# Patient Record
Sex: Female | Born: 1990 | Race: Black or African American | Hispanic: No | Marital: Single | Smoking: Never smoker
Health system: Southern US, Community
[De-identification: ages and names within clinical notes are randomized; demographics above are authoritative.]

## PROBLEM LIST (undated history)

## (undated) DIAGNOSIS — Z789 Other specified health status: Secondary | ICD-10-CM

## (undated) HISTORY — PX: HAND SURGERY: SHX662

## (undated) HISTORY — DX: Other specified health status: Z78.9

---

## 2009-02-28 ENCOUNTER — Emergency Department (HOSPITAL_COMMUNITY): Admission: EM | Admit: 2009-02-28 | Discharge: 2009-02-28 | Payer: Self-pay | Admitting: Emergency Medicine

## 2010-03-28 ENCOUNTER — Emergency Department (HOSPITAL_COMMUNITY): Admission: EM | Admit: 2010-03-28 | Discharge: 2010-03-28 | Payer: Self-pay | Admitting: Emergency Medicine
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
## 2011-04-20 ENCOUNTER — Emergency Department (HOSPITAL_COMMUNITY)
Admission: EM | Admit: 2011-04-20 | Discharge: 2011-04-20 | Disposition: A | Discharge status: Home / Self Care | Payer: No Typology Code available for payment source | Attending: Emergency Medicine | Admitting: Emergency Medicine

## 2011-04-20 ENCOUNTER — Encounter: Payer: Self-pay | Admitting: *Deleted

## 2011-04-20 DIAGNOSIS — R6883 Chills (without fever): Secondary | ICD-10-CM | POA: Insufficient documentation

## 2011-04-20 DIAGNOSIS — R059 Cough, unspecified: Secondary | ICD-10-CM | POA: Insufficient documentation

## 2011-04-20 DIAGNOSIS — J329 Chronic sinusitis, unspecified: Secondary | ICD-10-CM | POA: Insufficient documentation

## 2011-04-20 DIAGNOSIS — J3489 Other specified disorders of nose and nasal sinuses: Secondary | ICD-10-CM | POA: Insufficient documentation

## 2011-04-20 DIAGNOSIS — IMO0001 Reserved for inherently not codable concepts without codable children: Secondary | ICD-10-CM | POA: Insufficient documentation

## 2011-04-20 DIAGNOSIS — R05 Cough: Secondary | ICD-10-CM | POA: Insufficient documentation

## 2011-04-20 DIAGNOSIS — R51 Headache: Secondary | ICD-10-CM | POA: Insufficient documentation

## 2011-04-20 MED ORDER — FLUTICASONE PROPIONATE 50 MCG/ACT NA SUSP: 1.0000 | Freq: Every day | NASAL | Status: DC

## 2011-04-20 MED ORDER — IBUPROFEN 800 MG PO TABS: 800.0000 mg | ORAL_TABLET | Freq: Three times a day (TID) | ORAL | Status: AC

## 2011-04-20 NOTE — ED Notes (Signed)
Pt discharged home. Had no further questions. Vital signs stable. 

## 2011-04-20 NOTE — ED Provider Notes (Signed)
History     CSN: [id number] Arrival date & time: 04/20/2011  3:17 PM  5:29 PM HPI Patient reports sinus pressure. States associated with a frontal headache, cough, body aches, chills. 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 Denies fever, neck pain, chest pain, shortness of breath, sore throat, or ear pain. Reports she has tried tension headache medication without relief. Patient is a 20 y.o. female presenting with sinusitis. The history is provided by the patient.  Sinusitis  The current episode started 2 days ago. The problem has been gradually worsening. There has been no fever. The pain is mild. The pain has been constant since onset. Associated symptoms include chills, congestion, sinus pressure and cough. Pertinent negatives include no sweats, no ear pain, no hoarse voice, no sore throat, no swollen glands and no shortness of breath. She has tried acetaminophen for the symptoms. The treatment provided no relief.    History reviewed. No pertinent past medical history.  History reviewed. No pertinent past surgical history.  History reviewed. No pertinent family history.  History  Substance Use Topics  . Smoking status: Never Smoker   . Smokeless tobacco: Not on file  . Alcohol Use: No    OB History    Grav Para Term Preterm Abortions TAB SAB Ect Mult Living                  Review of Systems  Constitutional: Positive for chills. Negative for fever.  HENT: Positive for congestion, rhinorrhea and sinus pressure. Negative for ear pain, sore throat, hoarse voice, neck pain and neck stiffness.   Respiratory: Positive for cough. Negative for shortness of breath.   Gastrointestinal: Negative for nausea, vomiting and abdominal pain.  Genitourinary: Negative for dysuria, flank pain and vaginal discharge.  Musculoskeletal: Negative for back pain.  Neurological: Positive for headaches. Negative for dizziness, syncope, weakness and  numbness.  All other systems reviewed and are negative.    Allergies  Review of patient's allergies indicates no known allergies.  Home Medications   Current Outpatient Rx  Name Route Sig Dispense Refill  . DIPHENHYDRAMINE-APAP (SLEEP) 25-500 MG PO TABS Oral Take 2 tablets by mouth at bedtime as needed. Sleep/ cold       BP 107/80  Pulse 81  Temp(Src) 98.8 F (37.1 C) (Oral)  Resp 16  SpO2 100%  Physical Exam  Vitals reviewed. Constitutional: She is oriented to person, place, and time. Vital signs are normal. She appears well-developed and well-nourished.  HENT:  Head: Normocephalic and atraumatic.  Right Ear: Tympanic membrane, external ear and ear canal normal.  Left Ear: Tympanic membrane, external ear and ear canal normal.  Nose: No rhinorrhea. Right sinus exhibits maxillary sinus tenderness. Left sinus exhibits maxillary sinus tenderness.  Mouth/Throat: Uvula is midline, oropharynx is clear and moist and mucous membranes are normal.  Eyes: Conjunctivae are normal. Pupils are equal, round, and reactive to light.  Neck: Trachea normal, normal range of motion and full passive range of motion without pain. Neck supple. No spinous process tenderness and no muscular tenderness present. No rigidity. No mass present.  Cardiovascular: Normal rate, regular rhythm and normal heart sounds.  Exam reveals no friction rub.   No murmur heard. Pulmonary/Chest: Effort normal and breath sounds normal. She has no wheezes. She has no rhonchi. She has no rales. She exhibits no tenderness.  Musculoskeletal: Normal range of motion.  Neurological: She is alert and oriented to person, place,  and time. Coordination normal.  Skin: Skin is warm and dry. No rash noted. No erythema. No pallor.    ED Course  Procedures  MDM   Will treat patient for a sinusitis. Advised sinus drainage instructions. Will prescribe ibuprofen 800 mg and fluticasone. Advised to return for worsening symptoms. Patient  agrees to plan and is ready for discharge.       Thomasene Lot, [place] 04/20/11 [phone]

## 2011-04-20 NOTE — ED Notes (Signed)
Pt presents to department for evaluation of nasal congestion, headache, and productive cough. 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 Pt states "I feel like crap." she is alert and oriented x4. Respiration unlabored. Skin warm and dry. Ambulatory to exam room. No signs of distress at the time.

## 2011-04-20 NOTE — ED Notes (Signed)
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 Reports productive cough and runny nose. Denies any fevers. Reports headache.

## 2011-04-21 NOTE — ED Provider Notes (Signed)
Medical screening examination/treatment/procedure(s) were performed by non-physician practitioner and as supervising physician I was immediately available for consultation/collaboration.   Armanii Urbanik A. Patrica Duel, MD 04/21/11 1346
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
## 2011-07-22 ENCOUNTER — Encounter (HOSPITAL_COMMUNITY): Payer: Self-pay | Admitting: Nurse Practitioner

## 2011-07-22 ENCOUNTER — Emergency Department (HOSPITAL_COMMUNITY)
Admission: EM | Admit: 2011-07-22 | Discharge: 2011-07-22 | Disposition: A | Discharge status: Home / Self Care | Payer: No Typology Code available for payment source | Attending: Emergency Medicine | Admitting: Emergency Medicine

## 2011-07-22 DIAGNOSIS — R112 Nausea with vomiting, unspecified: Secondary | ICD-10-CM | POA: Insufficient documentation

## 2011-07-22 LAB — URINALYSIS, ROUTINE W REFLEX MICROSCOPIC
Hgb urine dipstick: NEGATIVE
Ketones, ur: 15 mg/dL — AB
Protein, ur: NEGATIVE mg/dL
Urobilinogen, UA: 2 mg/dL — ABNORMAL HIGH (ref 0.0–1.0)

## 2011-07-22 MED ORDER — ONDANSETRON 4 MG PO TBDP: 4.0000 mg | ORAL_TABLET | Freq: Three times a day (TID) | ORAL | Status: AC | PRN

## 2011-07-22 MED ORDER — ONDANSETRON 4 MG PO TBDP
8.0000 mg | ORAL_TABLET | Freq: Once | ORAL | Status: AC
  Administered 2011-07-22: 8 mg via ORAL
  Filled 2011-07-22: qty 2

## 2011-07-22 NOTE — Discharge Instructions (Signed)
Nausea and Vomiting  Nausea is a sick feeling that often comes before throwing up (vomiting). Vomiting is a reflex where stomach contents come out of your mouth. Vomiting can cause severe loss of body fluids (dehydration). Children and elderly adults can become dehydrated quickly, especially if they also have diarrhea. Nausea and vomiting are symptoms of a condition or disease. It is important to find the cause of your symptoms.  CAUSES    Direct irritation of the stomach lining. This irritation can result from increased acid production (gastroesophageal reflux disease), infection, food poisoning, taking certain medicines (such as nonsteroidal anti-inflammatory drugs), alcohol use, or tobacco use.   Signals from the brain.These signals could be caused by a headache, heat exposure, an inner ear disturbance, increased pressure in the brain from injury, infection, a tumor, or a concussion, pain, emotional stimulus, or metabolic problems.   An obstruction in the gastrointestinal tract (bowel obstruction).   Illnesses such as diabetes, hepatitis, gallbladder problems, appendicitis, kidney problems, cancer, sepsis, atypical symptoms of a heart attack, or eating disorders.   Medical treatments such as chemotherapy and radiation.   Receiving medicine that makes you sleep (general anesthetic) during surgery.  DIAGNOSIS  Your caregiver may ask for tests to be done if the problems do not improve after a few days. Tests may also be done if symptoms are severe or if the reason for the nausea and vomiting is not clear. Tests may include:   Urine tests.   Blood tests.   Stool tests.   Cultures (to look for evidence of infection).   X-rays or other imaging studies.  Test results can help your caregiver make decisions about treatment or the need for additional tests.  TREATMENT  You need to stay well hydrated. Drink frequently but in small amounts.You may wish to drink water, sports drinks, clear broth, or eat frozen  ice pops or gelatin dessert to help stay hydrated.When you eat, eating slowly may help prevent nausea.There are also some antinausea medicines that may help prevent nausea.  HOME CARE INSTRUCTIONS    Take all medicine as directed by your caregiver.   If you do not have an appetite, do not force yourself to eat. However, you must continue to drink fluids.   If you have an appetite, eat a normal diet unless your caregiver tells you differently.   Eat a variety of complex carbohydrates (rice, wheat, potatoes, bread), lean meats, yogurt, fruits, and vegetables.   Avoid high-fat foods because they are more difficult to digest.   Drink enough water and fluids to keep your urine clear or pale yellow.   If you are dehydrated, ask your caregiver for specific rehydration instructions. Signs of dehydration may include:   Severe thirst.   Dry lips and mouth.   Dizziness.   Dark urine.   Decreasing urine frequency and amount.   Confusion.   Rapid breathing or pulse.  SEEK IMMEDIATE MEDICAL CARE IF:    You have blood or brown flecks (like coffee grounds) in your vomit.   You have black or bloody stools.   You have a severe headache or stiff neck.   You are confused.   You have severe abdominal pain.   You have chest pain or trouble breathing.   You do not urinate at least once every 8 hours.   You develop cold or clammy skin.   You continue to vomit for longer than 24 to 48 hours.   You have a fever.  MAKE SURE YOU:      Understand these instructions.   Will watch your condition.   Will get help right away if you are not doing well or get worse.  Document Released: 05/03/2005 Document Revised: 04/22/2011 Document Reviewed: 09/30/2010  [employer] Patient Information 2012 [employer]  B.R.A.T. Diet  Your doctor has recommended the B.R.A.T. diet for you or your child until the condition improves. This is often used to help control diarrhea and vomiting symptoms. If you or your child can tolerate clear  liquids, you may have:   Bananas.   Rice.   Applesauce.   Toast (and other simple starches such as crackers, potatoes, noodles).  Be sure to avoid dairy products, meats, and fatty foods until symptoms are better. Fruit juices such as apple, grape, and prune juice can make diarrhea worse. Avoid these. Continue this diet for 2 days or as instructed by your caregiver.  Document Released: 05/03/2005 Document Revised: 04/22/2011 Document Reviewed: 10/20/2006  [employer] Patient Information 2012 [employer]

## 2011-07-22 NOTE — ED Notes (Signed)
Patient given po fluids. Will monitor for nausea and vomiting.

## 2011-07-22 NOTE — ED Provider Notes (Signed)
History     CSN: [id number]  Arrival date & time 07/22/11  1130   First MD Initiated Contact with Patient 07/22/11 1251      Chief Complaint  Patient presents with  . Nausea  . Emesis    (Consider location/radiation/quality/duration/timing/severity/associated sxs/prior treatment) HPI  Patient presents to emergency department complaining of gradual onset nausea and vomiting that began at noon yesterday. 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 Patient states that since noon yesterday she's had a proximally 5-6 episodes of vomiting stating that she has eaten nothing and drank small amounts since onset of symptoms. She denies any point specific abdominal pain. She states "my stomach feels queasy" but denies any abdominal pain. Patient denies aggravating or alleviating factors. She's taken no medication person prior to arrival. Patient states she has no known medical problems and takes no medicines on regular basis. Chest fevers, chills, chest pain, shortness of breath, abdominal pain, dysuria, hematuria, blood in her stool, vaginal discharge.  History reviewed. No pertinent past medical history.  History reviewed. No pertinent past surgical history.  History reviewed. No pertinent family history.  History  Substance Use Topics  . Smoking status: Never Smoker   . Smokeless tobacco: Not on file  . Alcohol Use: Yes     social    OB History    Grav Para Term Preterm Abortions TAB SAB Ect Mult Living                  Review of Systems  All other systems reviewed and are negative.    Allergies  Review of patient's allergies indicates no known allergies.  Home Medications   Current Outpatient Rx  Name Route Sig Dispense Refill  . IBUPROFEN 200 MG PO TABS Oral Take 400 mg by mouth every 6 (six) hours as needed. For cramps      BP 123/70  Pulse 70  Temp(Src) 98.8 F  (37.1 C) (Oral)  Resp 20  Ht 5\' 3"  (1.6 m)  Wt 145 lb (65.772 kg)  BMI 25.69 kg/m2  SpO2 100%  LMP 07/16/2011  Physical Exam  Nursing note and vitals reviewed. Constitutional: She is oriented to person, place, and time. She appears well-developed and well-nourished. No distress.  HENT:  Head: Normocephalic and atraumatic.  Eyes: Conjunctivae are normal.  Neck: Normal range of motion. Neck supple.  Cardiovascular: Normal rate, regular rhythm, normal heart sounds and intact distal pulses.  Exam reveals no gallop and no friction rub.   No murmur heard. Pulmonary/Chest: Effort normal and breath sounds normal. No respiratory distress. She has no wheezes. She has no rales. She exhibits no tenderness.  Abdominal: Bowel sounds are normal. She exhibits no distension and no mass. There is no tenderness. There is no rebound and no guarding.  Musculoskeletal: Normal range of motion. She exhibits no edema and no tenderness.  Neurological: She is alert and oriented to person, place, and time.  Skin: Skin is warm and dry. No rash noted. She is not diaphoretic. No erythema.  Psychiatric: She has a normal mood and affect.    ED Course  Procedures (including critical care time)  ODT zofran   Labs Reviewed  POCT PREGNANCY, URINE  URINALYSIS, ROUTINE W REFLEX MICROSCOPIC   No results found.   1. Nausea and vomiting       MDM  Patient is nontoxic-appearing and afebrile. Abdomen soft and nontender. She is no  known medical problems takes no medicines on regular basis. Question gastrointestinal illness given multiple sick contacts with GI symptoms. Spoke at length with patient about changing or worsening symptoms that should warrent return to ER otherwise will give symptomatic treatment for home        Jenness Corner, [place] 07/22/11 1322

## 2011-07-22 NOTE — ED Notes (Signed)
Discharged home with prescriptions and written instructions 

## 2011-07-22 NOTE — ED Notes (Signed)
Patient able to take fluids at present. Denies nausea.

## 2011-07-22 NOTE — ED Notes (Signed)
C/o upset stomach, n/v onset last night. Denies bowel/bladder changes

## 2011-07-23 NOTE — ED Provider Notes (Signed)
Medical screening examination/treatment/procedure(s) were performed by non-physician practitioner and as supervising physician I was immediately available for consultation/collaboration.  Magenta Schmiesing T Kentley Cedillo, MD 07/23/11 0912 

## 2013-01-01 ENCOUNTER — Emergency Department (INDEPENDENT_AMBULATORY_CARE_PROVIDER_SITE_OTHER)
Admission: EM | Admit: 2013-01-01 | Discharge: 2013-01-01 | Disposition: A | Discharge status: Home / Self Care | Payer: No Typology Code available for payment source | Source: Home / Self Care

## 2013-01-01 ENCOUNTER — Encounter (HOSPITAL_COMMUNITY): Payer: Self-pay

## 2013-01-01 ENCOUNTER — Other Ambulatory Visit (HOSPITAL_COMMUNITY)
Admission: RE | Admit: 2013-01-01 | Discharge: 2013-01-01 | Disposition: A | Discharge status: Home / Self Care | Payer: No Typology Code available for payment source | Source: Ambulatory Visit | Attending: Emergency Medicine | Admitting: Emergency Medicine

## 2013-01-01 DIAGNOSIS — Z113 Encounter for screening for infections with a predominantly sexual mode of transmission: Secondary | ICD-10-CM | POA: Insufficient documentation

## 2013-01-01 DIAGNOSIS — N760 Acute vaginitis: Secondary | ICD-10-CM | POA: Insufficient documentation

## 2013-01-01 DIAGNOSIS — T192XXA Foreign body in vulva and vagina, initial encounter: Secondary | ICD-10-CM

## 2013-01-01 LAB — POCT URINALYSIS DIP (DEVICE)
Hgb urine dipstick: NEGATIVE
Ketones, ur: NEGATIVE mg/dL
Protein, ur: NEGATIVE mg/dL
Specific Gravity, Urine: 1.025 (ref 1.005–1.030)
Urobilinogen, UA: 0.2 mg/dL (ref 0.0–1.0)

## 2013-01-01 MED ORDER — METRONIDAZOLE 500 MG PO TABS: 500.0000 mg | ORAL_TABLET | Freq: Two times a day (BID) | ORAL | Status: DC

## 2013-01-01 NOTE — ED Notes (Signed)
Call back number for labs verified 

## 2013-01-01 NOTE — ED Notes (Signed)
C/o ~2 month duration of vaginal discharge, has started to look grey and has an odor

## 2013-01-01 NOTE — ED Provider Notes (Signed)
Chief Complaint:   Chief Complaint  Patient presents with  . Vaginal Discharge    History of Present Illness:   Tricia Jones is a 22 year old female who has had a 2 month history of a malodorous, brown vaginal discharge. She denies any itching or vaginal or vulvar irritation. She's had no pelvic or lower back pain, external lesions, fever, chills, nausea, or vomiting. No urinary symptoms. Her menses are regular. Last menstrual period was July 27. Patient is sexually active with use of condoms about 50% of the time. She does not think she is pregnant. No prior history of STDs, vaginitis, or other GYN problems.  Review of Systems:  Other than noted above, the patient denies any of the following symptoms: Systemic:  No fever, chills, sweats, or weight loss. GI:  No abdominal pain, nausea, anorexia, vomiting, diarrhea, constipation, melena or hematochezia. GU:  No dysuria, frequency, urgency, hematuria, vaginal discharge, itching, or abnormal vaginal bleeding. Skin:  No rash or itching.  PMFSH:  Past medical history, family history, social history, meds, and allergies were reviewed.    Physical Exam:   Vital signs:  BP 110/71  Pulse 77  Temp(Src) 98.6 F (37 C) (Oral)  Resp 10  SpO2 98% General:  Alert, oriented and in no distress. Lungs:  Breath sounds clear and equal bilaterally.  No wheezes, rales or rhonchi. Heart:  Regular rhythm.  No gallops or murmers. Abdomen:  Soft, flat and non-distended.  No organomegaly or mass.  No tenderness, guarding or rebound.  Bowel sounds normally active. Pelvic exam:  Normal external genitalia, speculum exam reveals a retained tampon. This was removed with a ring forceps. There was some yellowish discharge present. Cervix and vaginal mucosa otherwise appeared normal. No pain on cervical motion. Uterus was normal in size and shape and nontender. No adnexal masses or tenderness. Skin:  Clear, warm and dry.  Labs:   Results for orders placed during the  hospital encounter of 01/01/13  POCT URINALYSIS DIP (DEVICE)      Result Value Range   Glucose, UA NEGATIVE  NEGATIVE mg/dL   Bilirubin Urine NEGATIVE  NEGATIVE   Ketones, ur NEGATIVE  NEGATIVE mg/dL   Specific Gravity, Urine 1.025  1.005 - 1.030   Hgb urine dipstick NEGATIVE  NEGATIVE   pH 7.0  5.0 - 8.0   Protein, ur NEGATIVE  NEGATIVE mg/dL   Urobilinogen, UA 0.2  0.0 - 1.0 mg/dL   Nitrite NEGATIVE  NEGATIVE   Leukocytes, UA NEGATIVE  NEGATIVE  POCT PREGNANCY, URINE      Result Value Range   Preg Test, Ur NEGATIVE  NEGATIVE    Vaginal DNA probes for gonorrhea, Chlamydia, Trichomonas, Gardnerella, Candida obtained.  Assessment:  The primary encounter diagnosis was Vaginitis. A diagnosis of Retained tampon, initial encounter was also pertinent to this visit.  She has copious malodorous discharge after the tampon was removed, will treat as for BV.  Plan:   1.  The following meds were prescribed:   Discharge Medication List as of 01/01/2013  9:02 PM    START taking these medications   Details  metroNIDAZOLE (FLAGYL) 500 MG tablet Take 1 tablet (500 mg total) by mouth 2 (two) times daily., Starting 01/01/2013, Until Discontinued, Normal       2.  The patient was instructed in symptomatic care and handouts were given. 3.  The patient was told to return if becoming worse in any way, if no better in 3 or 4 days, and given some red  flag symptoms such as pelvic pain or fever that would indicate earlier return. 4.  Follow up here as necessary.    Reuben Likes, MD 01/01/13 2157

## 2013-01-03 NOTE — ED Notes (Signed)
GC/Chlamydia neg., Affirm: Candida and Trich neg., Gardnerella pos.  Pt. adequately treated with Flagyl. Vassie Moselle 01/03/2013

## 2013-02-15 ENCOUNTER — Emergency Department (HOSPITAL_COMMUNITY)
Admission: EM | Admit: 2013-02-15 | Discharge: 2013-02-15 | Disposition: A | Discharge status: Home / Self Care | Payer: No Typology Code available for payment source | Attending: Emergency Medicine | Admitting: Emergency Medicine

## 2013-02-15 ENCOUNTER — Encounter (HOSPITAL_COMMUNITY): Payer: Self-pay | Admitting: Emergency Medicine

## 2013-02-15 DIAGNOSIS — N899 Noninflammatory disorder of vagina, unspecified: Secondary | ICD-10-CM | POA: Insufficient documentation

## 2013-02-15 DIAGNOSIS — N898 Other specified noninflammatory disorders of vagina: Secondary | ICD-10-CM

## 2013-02-15 MED ORDER — HYDROCODONE-ACETAMINOPHEN 5-325 MG PO TABS
1.0000 | ORAL_TABLET | Freq: Once | ORAL | Status: DC
  Filled 2013-02-15: qty 1

## 2013-02-15 MED ORDER — HYDROCODONE-ACETAMINOPHEN 5-325 MG PO TABS: ORAL_TABLET | ORAL | Status: DC

## 2013-02-15 NOTE — ED Notes (Signed)
Pt. reports abscess at left groin with no drainage onset 1 week ago / pain radiating to left upper inner thigh.

## 2013-02-15 NOTE — ED Notes (Signed)
Pt. States she was on antibiotic for vaginal discharge after tampon was left in vaginal for "a couple months". Pt. States she did not take antibiotic correctly. Denies discharge at this time but reports abscess on left groin area.

## 2013-02-15 NOTE — ED Provider Notes (Signed)
CSN: [id number]     Arrival date & time 02/15/13  1857 History   First MD Initiated Contact with Patient 02/15/13 2026     Chief Complaint  Patient presents with  . Abscess   (Consider location/radiation/quality/duration/timing/severity/associated sxs/prior Treatment) HPI  Tricia Jones is a 22 y.o. female complaining of pain and swelling to left labia worsening over the course of the day. Pt denies prior episodes, fever, N/V, abd pain. States that she was treated with antibiotic after a tampon was found in her vagina for unkown time period. Did not finnish off her antibiotics as directed (12/2012)  History reviewed. No pertinent past medical history. Past Surgical History  Procedure Laterality Date  . Hand surgery     No family history on file. History  Substance Use Topics  . Smoking status: Never Smoker   . Smokeless tobacco: Not on file  . Alcohol Use: Yes     Comment: social   OB History   Grav Para Term Preterm Abortions TAB SAB Ect Mult Living                 Review of Systems 10 systems reviewed and found to be negative, except as noted in the HPI   Allergies  Review of patient's allergies indicates no known allergies.  Home Medications   Current Outpatient Rx  Name  Route  Sig  Dispense  Refill  . ibuprofen (ADVIL,MOTRIN) 200 MG tablet   Oral   Take 400 mg by mouth every 6 (six) hours as needed (cramps).           BP 115/68  Pulse 61  Temp(Src) 98.2 F (36.8 C) (Oral)  Resp 14  SpO2 100%  LMP 01/31/2013 Physical Exam  Nursing note and vitals reviewed. Constitutional: She is oriented to person, place, and time. She appears well-developed and well-nourished. No distress.  HENT:  Head: Normocephalic and atraumatic.  Eyes: Conjunctivae and EOM are normal. Pupils are equal, round, and reactive to light.  Neck: Normal range of motion.  Cardiovascular: Normal rate.   Pulmonary/Chest: Effort normal and breath sounds normal. No stridor. No respiratory  distress. She has no wheezes. She has no rales. She exhibits no tenderness.  Abdominal: Soft. Bowel sounds are normal. She exhibits no distension and no mass. There is no tenderness. There is no rebound and no guarding.  Genitourinary: Vagina normal.    No vaginal discharge found.  Musculoskeletal: Normal range of motion.  Neurological: She is alert and oriented to person, place, and time.  Psychiatric: She has a normal mood and affect.    ED Course  Procedures (including critical care time) Labs Review Labs Reviewed - No data to display Imaging Review No results found.  MDM   1. Vaginal irritation    Filed Vitals:   02/15/13 1928 02/15/13 2127  BP: 115/68 124/79  Pulse: 61 57  Temp: 98.2 F (36.8 C) 98.1 F (36.7 C)  TempSrc: Oral Oral  Resp: 14 16  SpO2: 100% 100%     [name] is a 22 y.o. female pt reports painful bump to left inguinal area, but not was observed on physical exam, Pt cannot located the bump she felt earlier. Benign exam otherwise with no active GU/GYN complaints  Pt is hemodynamically stable, appropriate for, and amenable to discharge at this time. Pt verbalized understanding and agrees with care plan. All questions answered. Outpatient follow-up and specific return precautions discussed.    Discharge Medication List as of 02/15/2013  9:11 PM  START taking these medications   Details  HYDROcodone-acetaminophen (NORCO/VICODIN) 5-325 MG per tablet Take 1-2 tablets by mouth every 6 hours as needed for pain., Print        Note: Portions of this report may have been transcribed using voice recognition software. Every effort was made to ensure accuracy; however, inadvertent computerized transcription errors may be present      Wynetta Emery, PA-C 02/17/13 1403

## 2013-02-17 NOTE — ED Provider Notes (Signed)
Medical screening examination/treatment/procedure(s) were performed by non-physician practitioner and as supervising physician I was immediately available for consultation/collaboration.   Candyce Churn, MD 02/17/13 [phone]

## 2014-07-31 LAB — OB RESULTS CONSOLE HIV ANTIBODY (ROUTINE TESTING): HIV: NONREACTIVE

## 2014-07-31 LAB — OB RESULTS CONSOLE GC/CHLAMYDIA
CHLAMYDIA, DNA PROBE: NEGATIVE
GC PROBE AMP, GENITAL: NEGATIVE

## 2014-07-31 LAB — OB RESULTS CONSOLE HEPATITIS B SURFACE ANTIGEN: Hepatitis B Surface Ag: NEGATIVE

## 2014-07-31 LAB — OB RESULTS CONSOLE RUBELLA ANTIBODY, IGM: Rubella: IMMUNE

## 2014-07-31 LAB — OB RESULTS CONSOLE RPR: RPR: NONREACTIVE

## 2014-07-31 LAB — OB RESULTS CONSOLE ABO/RH: RH Type: POSITIVE

## 2014-07-31 LAB — OB RESULTS CONSOLE ANTIBODY SCREEN: Antibody Screen: NEGATIVE

## 2015-01-29 LAB — OB RESULTS CONSOLE GC/CHLAMYDIA
CHLAMYDIA, DNA PROBE: NEGATIVE
GC PROBE AMP, GENITAL: NEGATIVE

## 2015-01-29 LAB — OB RESULTS CONSOLE GBS: GBS: NEGATIVE

## 2015-02-27 ENCOUNTER — Telehealth (HOSPITAL_COMMUNITY): Payer: Self-pay | Admitting: *Deleted

## 2015-02-27 ENCOUNTER — Encounter (HOSPITAL_COMMUNITY): Payer: Self-pay | Admitting: *Deleted

## 2015-02-27 NOTE — Telephone Encounter (Signed)
Preadmission screen  

## 2015-03-02 ENCOUNTER — Encounter (HOSPITAL_COMMUNITY): Payer: Self-pay

## 2015-03-02 ENCOUNTER — Inpatient Hospital Stay (HOSPITAL_COMMUNITY)
Admission: RE | Admit: 2015-03-02 | Discharge: 2015-03-06 | DRG: 766 | Disposition: A | Discharge status: Home / Self Care | Payer: 59 | Source: Ambulatory Visit | Attending: Obstetrics | Admitting: Obstetrics

## 2015-03-02 ENCOUNTER — Inpatient Hospital Stay (HOSPITAL_COMMUNITY): Payer: 59 | Admitting: Anesthesiology

## 2015-03-02 DIAGNOSIS — O480 Post-term pregnancy: Principal | ICD-10-CM | POA: Diagnosis present

## 2015-03-02 DIAGNOSIS — Z98891 History of uterine scar from previous surgery: Secondary | ICD-10-CM

## 2015-03-02 DIAGNOSIS — Z3A40 40 weeks gestation of pregnancy: Secondary | ICD-10-CM

## 2015-03-02 DIAGNOSIS — Z3490 Encounter for supervision of normal pregnancy, unspecified, unspecified trimester: Secondary | ICD-10-CM

## 2015-03-02 LAB — CBC
HCT: 26.8 % — ABNORMAL LOW (ref 36.0–46.0)
Hemoglobin: 8.9 g/dL — ABNORMAL LOW (ref 12.0–15.0)
MCH: 29.6 pg (ref 26.0–34.0)
MCHC: 33.2 g/dL (ref 30.0–36.0)
MCV: 89 fL (ref 78.0–100.0)
PLATELETS: 225 10*3/uL (ref 150–400)
RBC: 3.01 MIL/uL — AB (ref 3.87–5.11)
RDW: 13.7 % (ref 11.5–15.5)
WBC: 7.3 10*3/uL (ref 4.0–10.5)

## 2015-03-02 LAB — TYPE AND SCREEN
ABO/RH(D): B POS
ANTIBODY SCREEN: NEGATIVE

## 2015-03-02 LAB — RPR: RPR Ser Ql: NONREACTIVE

## 2015-03-02 LAB — ABO/RH: ABO/RH(D): B POS

## 2015-03-02 MED ORDER — OXYTOCIN 40 UNITS IN LACTATED RINGERS INFUSION - SIMPLE MED
1.0000 m[IU]/min | INTRAVENOUS | Status: DC
  Administered 2015-03-02: 2 m[IU]/min via INTRAVENOUS
  Filled 2015-03-02: qty 1000

## 2015-03-02 MED ORDER — TERBUTALINE SULFATE 1 MG/ML IJ SOLN: 0.2500 mg | Freq: Once | INTRAMUSCULAR | Status: DC | PRN

## 2015-03-02 MED ORDER — OXYCODONE-ACETAMINOPHEN 5-325 MG PO TABS: 2.0000 | ORAL_TABLET | ORAL | Status: DC | PRN

## 2015-03-02 MED ORDER — FENTANYL 2.5 MCG/ML BUPIVACAINE 1/10 % EPIDURAL INFUSION (WH - ANES)
INTRAMUSCULAR | Status: AC
  Administered 2015-03-02: 14 mL/h via EPIDURAL
  Filled 2015-03-02: qty 125

## 2015-03-02 MED ORDER — OXYCODONE-ACETAMINOPHEN 5-325 MG PO TABS
1.0000 | ORAL_TABLET | ORAL | Status: DC | PRN
  Administered 2015-03-05: 1 via ORAL

## 2015-03-02 MED ORDER — LACTATED RINGERS IV SOLN
500.0000 mL | INTRAVENOUS | Status: DC | PRN
  Administered 2015-03-02: 500 mL via INTRAVENOUS

## 2015-03-02 MED ORDER — BUTORPHANOL TARTRATE 1 MG/ML IJ SOLN
1.0000 mg | INTRAMUSCULAR | Status: DC | PRN
  Administered 2015-03-02 (×2): 1 mg via INTRAVENOUS
  Filled 2015-03-02 (×2): qty 1

## 2015-03-02 MED ORDER — ACETAMINOPHEN 325 MG PO TABS: 650.0000 mg | ORAL_TABLET | ORAL | Status: DC | PRN

## 2015-03-02 MED ORDER — DIPHENHYDRAMINE HCL 50 MG/ML IJ SOLN: 12.5000 mg | INTRAMUSCULAR | Status: DC | PRN

## 2015-03-02 MED ORDER — FENTANYL 2.5 MCG/ML BUPIVACAINE 1/10 % EPIDURAL INFUSION (WH - ANES)
14.0000 mL/h | INTRAMUSCULAR | Status: DC | PRN
  Administered 2015-03-02 (×3): 14 mL/h via EPIDURAL
  Filled 2015-03-02: qty 125

## 2015-03-02 MED ORDER — OXYTOCIN BOLUS FROM INFUSION: 500.0000 mL | INTRAVENOUS | Status: DC

## 2015-03-02 MED ORDER — OXYTOCIN 40 UNITS IN LACTATED RINGERS INFUSION - SIMPLE MED: 62.5000 mL/h | INTRAVENOUS | Status: DC

## 2015-03-02 MED ORDER — CITRIC ACID-SODIUM CITRATE 334-500 MG/5ML PO SOLN
30.0000 mL | ORAL | Status: DC | PRN
  Administered 2015-03-03: 30 mL via ORAL
  Filled 2015-03-02: qty 15

## 2015-03-02 MED ORDER — ONDANSETRON HCL 4 MG/2ML IJ SOLN
4.0000 mg | Freq: Four times a day (QID) | INTRAMUSCULAR | Status: DC | PRN
  Administered 2015-03-02: 4 mg via INTRAVENOUS
  Filled 2015-03-02: qty 2

## 2015-03-02 MED ORDER — MISOPROSTOL 25 MCG QUARTER TABLET
25.0000 ug | ORAL_TABLET | Freq: Once | ORAL | Status: AC
  Administered 2015-03-02: 25 ug via VAGINAL
  Filled 2015-03-02: qty 0.25

## 2015-03-02 MED ORDER — EPHEDRINE 5 MG/ML INJ: 10.0000 mg | INTRAVENOUS | Status: DC | PRN

## 2015-03-02 MED ORDER — PHENYLEPHRINE 40 MCG/ML (10ML) SYRINGE FOR IV PUSH (FOR BLOOD PRESSURE SUPPORT)
80.0000 ug | PREFILLED_SYRINGE | INTRAVENOUS | Status: AC | PRN
  Administered 2015-03-03 (×3): 80 ug via INTRAVENOUS
  Administered 2015-03-03: 40 ug via INTRAVENOUS
  Administered 2015-03-03: 80 ug via INTRAVENOUS
  Administered 2015-03-03: 40 ug via INTRAVENOUS
  Administered 2015-03-03: 80 ug via INTRAVENOUS

## 2015-03-02 MED ORDER — LACTATED RINGERS IV SOLN
INTRAVENOUS | Status: DC
  Administered 2015-03-02 (×3): via INTRAVENOUS

## 2015-03-02 MED ORDER — LIDOCAINE HCL (PF) 1 % IJ SOLN
INTRAMUSCULAR | Status: DC | PRN
  Administered 2015-03-02: 4 mL via EPIDURAL
  Administered 2015-03-02: 2 mL via EPIDURAL
  Administered 2015-03-02: 4 mL via EPIDURAL

## 2015-03-02 MED ORDER — LIDOCAINE HCL (PF) 1 % IJ SOLN
30.0000 mL | INTRAMUSCULAR | Status: DC | PRN
  Filled 2015-03-02: qty 30

## 2015-03-02 MED ORDER — PHENYLEPHRINE 40 MCG/ML (10ML) SYRINGE FOR IV PUSH (FOR BLOOD PRESSURE SUPPORT)
PREFILLED_SYRINGE | INTRAVENOUS | Status: AC
  Filled 2015-03-02: qty 20

## 2015-03-02 NOTE — Anesthesia Preprocedure Evaluation (Signed)
Anesthesia Evaluation  Patient identified by MRN, date of birth, ID band Patient awake    Reviewed: Allergy & Precautions, H&P , NPO status , Patient's Chart, lab work & pertinent test results  Airway Mallampati: II  TM Distance: >3 FB Neck ROM: full    Dental no notable dental hx.    Pulmonary neg pulmonary ROS,    Pulmonary exam normal breath sounds clear to auscultation       Cardiovascular negative cardio ROS Normal cardiovascular exam Rhythm:regular Rate:Normal     Neuro/Psych negative neurological ROS  negative psych ROS   GI/Hepatic negative GI ROS, Neg liver ROS,   Endo/Other  negative endocrine ROS  Renal/GU negative Renal ROS  negative genitourinary   Musculoskeletal   Abdominal   Peds  Hematology negative hematology ROS (+)   Anesthesia Other Findings Pregnancy - uncomplicated Platelets and allergies reviewed Denies active cardiac or pulmonary symptoms, METS > 4  Denies blood thinning medications, bleeding disorders, hypertension, asthma, supine hypotension syndrome, previous anesthesia difficulties    Reproductive/Obstetrics (+) Pregnancy                             Anesthesia Physical Anesthesia Plan  ASA: II  Anesthesia Plan: Epidural   Post-op Pain Management:    Induction:   Airway Management Planned:   Additional Equipment:   Intra-op Plan:   Post-operative Plan:   Informed Consent: I have reviewed the patients History and Physical, chart, labs and discussed the procedure including the risks, benefits and alternatives for the proposed anesthesia with the patient or authorized representative who has indicated his/her understanding and acceptance.     Plan Discussed with: Anesthesiologist and CRNA  Anesthesia Plan Comments:         Anesthesia Quick Evaluation

## 2015-03-02 NOTE — Anesthesia Procedure Notes (Signed)
Epidural Patient location during procedure: OB  Staffing Anesthesiologist: Chestina Komatsu Performed by: anesthesiologist   Preanesthetic Checklist Completed: patient identified, site marked, surgical consent, pre-op evaluation, timeout performed, IV checked, risks and benefits discussed and monitors and equipment checked  Epidural Patient position: sitting Prep: site prepped and draped and DuraPrep Patient monitoring: continuous pulse ox and blood pressure Approach: midline Location: L3-L4 Injection technique: LOR saline  Needle:  Needle type: Tuohy  Needle gauge: 17 G Needle length: 9 cm and 9 Needle insertion depth: 6 cm Catheter type: closed end flexible Catheter size: 19 Gauge Catheter at skin depth: 10 cm Test dose: negative  Assessment Events: blood not aspirated, injection not painful, no injection resistance, negative IV test and no paresthesia  Additional Notes Patient identified. Risks/Benefits/Options discussed with patient including but not limited to bleeding, infection, nerve damage, paralysis, failed block, incomplete pain control, headache, blood pressure changes, nausea, vomiting, reactions to medications, itching and postpartum back pain. Confirmed with bedside nurse the patient's most recent platelet count. Confirmed with patient that they are not currently taking any anticoagulation, have any bleeding history or any family history of bleeding disorders. Patient expressed understanding and wished to proceed. All questions were answered. Sterile technique was used throughout the entire procedure. Please see nursing notes for vital signs. Test dose was given through epidural catheter and negative prior to continuing to dose epidural or start infusion. Warning signs of high block given to the patient including shortness of breath, tingling/numbness in hands, complete motor block, or any concerning symptoms with instructions to call for help. Patient was given  instructions on fall risk and not to get out of bed. All questions and concerns addressed with instructions to call with any issues or inadequate analgesia.

## 2015-03-02 NOTE — H&P (Signed)
This is Dr. Francoise CeoBernard Marshall dictating the history and physical on Tricia Jones she is a 24 year old gravida 1 at 40 weeks and 4 days negative GBS was brought in for induction she received Cytotec last night and her cervix is 1 cm 8% anterior and the vertex -2 station amniotomy was performed and the fluid is clear she is now on low-dose Pitocin Past medical history negative Past surgical history negative Social history negative System review negative Physical exam well-developed female in early labor HEENT negative Lungs clear to P&A Heart regular rhythm no murmurs no gallops Breasts negative Abdomen term Pelvic as described above Extremities negative

## 2015-03-03 ENCOUNTER — Encounter (HOSPITAL_COMMUNITY)
Admission: RE | Disposition: A | Discharge status: Home / Self Care | Payer: Self-pay | Source: Ambulatory Visit | Attending: Obstetrics

## 2015-03-03 ENCOUNTER — Encounter (HOSPITAL_COMMUNITY): Payer: Self-pay

## 2015-03-03 DIAGNOSIS — Z98891 History of uterine scar from previous surgery: Secondary | ICD-10-CM

## 2015-03-03 LAB — CBC
HCT: 24.5 % — ABNORMAL LOW (ref 36.0–46.0)
Hemoglobin: 8.2 g/dL — ABNORMAL LOW (ref 12.0–15.0)
MCH: 29.9 pg (ref 26.0–34.0)
MCHC: 33.5 g/dL (ref 30.0–36.0)
MCV: 89.4 fL (ref 78.0–100.0)
Platelets: 200 K/uL (ref 150–400)
RBC: 2.74 MIL/uL — ABNORMAL LOW (ref 3.87–5.11)
RDW: 13.8 % (ref 11.5–15.5)
WBC: 17.9 K/uL — ABNORMAL HIGH (ref 4.0–10.5)

## 2015-03-03 SURGERY — Surgical Case
Anesthesia: Epidural | Site: Abdomen

## 2015-03-03 MED ORDER — SCOPOLAMINE 1 MG/3DAYS TD PT72
MEDICATED_PATCH | TRANSDERMAL | Status: DC | PRN
  Administered 2015-03-03: 1 via TRANSDERMAL

## 2015-03-03 MED ORDER — MORPHINE SULFATE (PF) 0.5 MG/ML IJ SOLN
INTRAMUSCULAR | Status: DC | PRN
  Administered 2015-03-03: 4 mg via EPIDURAL

## 2015-03-03 MED ORDER — SODIUM CHLORIDE 0.9 % IJ SOLN: 3.0000 mL | INTRAMUSCULAR | Status: DC | PRN

## 2015-03-03 MED ORDER — SENNOSIDES-DOCUSATE SODIUM 8.6-50 MG PO TABS
2.0000 | ORAL_TABLET | ORAL | Status: DC
  Administered 2015-03-03 – 2015-03-06 (×3): 2 via ORAL
  Filled 2015-03-03 (×3): qty 2

## 2015-03-03 MED ORDER — OXYTOCIN 40 UNITS IN LACTATED RINGERS INFUSION - SIMPLE MED: 62.5000 mL/h | INTRAVENOUS | Status: AC

## 2015-03-03 MED ORDER — TETANUS-DIPHTH-ACELL PERTUSSIS 5-2.5-18.5 LF-MCG/0.5 IM SUSP
0.5000 mL | Freq: Once | INTRAMUSCULAR | Status: AC
  Administered 2015-03-04: 0.5 mL via INTRAMUSCULAR
  Filled 2015-03-03: qty 0.5

## 2015-03-03 MED ORDER — DIPHENHYDRAMINE HCL 50 MG/ML IJ SOLN
12.5000 mg | INTRAMUSCULAR | Status: DC | PRN
  Administered 2015-03-03: 12.5 mg via INTRAVENOUS
  Filled 2015-03-03: qty 1

## 2015-03-03 MED ORDER — NALBUPHINE HCL 10 MG/ML IJ SOLN: 5.0000 mg | Freq: Once | INTRAMUSCULAR | Status: DC | PRN

## 2015-03-03 MED ORDER — LACTATED RINGERS IV BOLUS (SEPSIS)
500.0000 mL | Freq: Once | INTRAVENOUS | Status: AC
  Administered 2015-03-03: 500 mL via INTRAVENOUS

## 2015-03-03 MED ORDER — SIMETHICONE 80 MG PO CHEW
80.0000 mg | CHEWABLE_TABLET | ORAL | Status: DC
  Administered 2015-03-03 – 2015-03-06 (×3): 80 mg via ORAL
  Filled 2015-03-03 (×3): qty 1

## 2015-03-03 MED ORDER — NALBUPHINE HCL 10 MG/ML IJ SOLN: 5.0000 mg | INTRAMUSCULAR | Status: DC | PRN

## 2015-03-03 MED ORDER — MEPERIDINE HCL 25 MG/ML IJ SOLN: 6.2500 mg | INTRAMUSCULAR | Status: DC | PRN

## 2015-03-03 MED ORDER — DIPHENHYDRAMINE HCL 25 MG PO CAPS
25.0000 mg | ORAL_CAPSULE | ORAL | Status: DC | PRN
  Administered 2015-03-03 (×2): 25 mg via ORAL
  Filled 2015-03-03 (×2): qty 1

## 2015-03-03 MED ORDER — MEPERIDINE HCL 25 MG/ML IJ SOLN
INTRAMUSCULAR | Status: AC
  Filled 2015-03-03: qty 1

## 2015-03-03 MED ORDER — LACTATED RINGERS IV SOLN
INTRAVENOUS | Status: DC
  Administered 2015-03-03 (×2): via INTRAVENOUS

## 2015-03-03 MED ORDER — MENTHOL 3 MG MT LOZG
1.0000 | LOZENGE | OROMUCOSAL | Status: DC | PRN
Start: 2015-03-03 — End: 2015-03-06

## 2015-03-03 MED ORDER — SIMETHICONE 80 MG PO CHEW
80.0000 mg | CHEWABLE_TABLET | Freq: Three times a day (TID) | ORAL | Status: DC
  Administered 2015-03-03 – 2015-03-06 (×10): 80 mg via ORAL
  Filled 2015-03-03 (×10): qty 1

## 2015-03-03 MED ORDER — SODIUM BICARBONATE 8.4 % IV SOLN
INTRAVENOUS | Status: DC | PRN
  Administered 2015-03-03 (×3): 5 mL via EPIDURAL

## 2015-03-03 MED ORDER — PRENATAL MULTIVITAMIN CH
1.0000 | ORAL_TABLET | Freq: Every day | ORAL | Status: DC
  Administered 2015-03-03 – 2015-03-06 (×4): 1 via ORAL
  Filled 2015-03-03 (×4): qty 1

## 2015-03-03 MED ORDER — ACETAMINOPHEN 500 MG PO TABS
1000.0000 mg | ORAL_TABLET | Freq: Four times a day (QID) | ORAL | Status: AC
  Administered 2015-03-03 (×2): 1000 mg via ORAL
  Filled 2015-03-03 (×2): qty 2

## 2015-03-03 MED ORDER — ONDANSETRON HCL 4 MG/2ML IJ SOLN
INTRAMUSCULAR | Status: AC
  Filled 2015-03-03: qty 2

## 2015-03-03 MED ORDER — CEFAZOLIN SODIUM-DEXTROSE 2-3 GM-% IV SOLR
INTRAVENOUS | Status: AC
  Filled 2015-03-03: qty 50

## 2015-03-03 MED ORDER — ACETAMINOPHEN 325 MG PO TABS: 650.0000 mg | ORAL_TABLET | ORAL | Status: DC | PRN

## 2015-03-03 MED ORDER — OXYTOCIN 10 UNIT/ML IJ SOLN
INTRAMUSCULAR | Status: AC
  Filled 2015-03-03: qty 4

## 2015-03-03 MED ORDER — DIBUCAINE 1 % RE OINT: 1.0000 "application " | TOPICAL_OINTMENT | RECTAL | Status: DC | PRN

## 2015-03-03 MED ORDER — OXYCODONE-ACETAMINOPHEN 5-325 MG PO TABS
2.0000 | ORAL_TABLET | ORAL | Status: DC | PRN
  Administered 2015-03-04 – 2015-03-06 (×7): 2 via ORAL
  Filled 2015-03-03 (×8): qty 2

## 2015-03-03 MED ORDER — SCOPOLAMINE 1 MG/3DAYS TD PT72
1.0000 | MEDICATED_PATCH | Freq: Once | TRANSDERMAL | Status: DC
  Filled 2015-03-03: qty 1

## 2015-03-03 MED ORDER — OXYCODONE-ACETAMINOPHEN 5-325 MG PO TABS
1.0000 | ORAL_TABLET | ORAL | Status: DC | PRN
  Administered 2015-03-03: 1 via ORAL
  Filled 2015-03-03: qty 1

## 2015-03-03 MED ORDER — PROMETHAZINE HCL 25 MG/ML IJ SOLN: 6.2500 mg | INTRAMUSCULAR | Status: DC | PRN

## 2015-03-03 MED ORDER — CEFAZOLIN SODIUM-DEXTROSE 2-3 GM-% IV SOLR
INTRAVENOUS | Status: DC | PRN
  Administered 2015-03-03: 2 g via INTRAVENOUS

## 2015-03-03 MED ORDER — NALOXONE HCL 1 MG/ML IJ SOLN
1.0000 ug/kg/h | INTRAVENOUS | Status: DC | PRN
  Filled 2015-03-03: qty 2

## 2015-03-03 MED ORDER — LANOLIN HYDROUS EX OINT: 1.0000 "application " | TOPICAL_OINTMENT | CUTANEOUS | Status: DC | PRN

## 2015-03-03 MED ORDER — KETOROLAC TROMETHAMINE 30 MG/ML IJ SOLN: 30.0000 mg | Freq: Four times a day (QID) | INTRAMUSCULAR | Status: AC | PRN

## 2015-03-03 MED ORDER — FENTANYL CITRATE (PF) 100 MCG/2ML IJ SOLN
25.0000 ug | INTRAMUSCULAR | Status: DC | PRN
Start: 2015-03-03 — End: 2015-03-03

## 2015-03-03 MED ORDER — NALOXONE HCL 0.4 MG/ML IJ SOLN
0.4000 mg | INTRAMUSCULAR | Status: DC | PRN
Start: 2015-03-03 — End: 2015-03-06

## 2015-03-03 MED ORDER — DIPHENHYDRAMINE HCL 25 MG PO CAPS: 25.0000 mg | ORAL_CAPSULE | Freq: Four times a day (QID) | ORAL | Status: DC | PRN

## 2015-03-03 MED ORDER — IBUPROFEN 600 MG PO TABS
600.0000 mg | ORAL_TABLET | Freq: Four times a day (QID) | ORAL | Status: DC
  Administered 2015-03-03 – 2015-03-06 (×13): 600 mg via ORAL
  Filled 2015-03-03 (×13): qty 1

## 2015-03-03 MED ORDER — NALBUPHINE HCL 10 MG/ML IJ SOLN
5.0000 mg | INTRAMUSCULAR | Status: DC | PRN
Start: 2015-03-03 — End: 2015-03-06
  Administered 2015-03-03: 5 mg via SUBCUTANEOUS
  Filled 2015-03-03: qty 1

## 2015-03-03 MED ORDER — ONDANSETRON HCL 4 MG/2ML IJ SOLN
INTRAMUSCULAR | Status: DC | PRN
Start: 2015-03-03 — End: 2015-03-03
  Administered 2015-03-03: 4 mg via INTRAVENOUS

## 2015-03-03 MED ORDER — ONDANSETRON HCL 4 MG/2ML IJ SOLN
4.0000 mg | Freq: Three times a day (TID) | INTRAMUSCULAR | Status: DC | PRN
Start: 2015-03-03 — End: 2015-03-06

## 2015-03-03 MED ORDER — MORPHINE SULFATE (PF) 0.5 MG/ML IJ SOLN
INTRAMUSCULAR | Status: AC
  Filled 2015-03-03: qty 100

## 2015-03-03 MED ORDER — WITCH HAZEL-GLYCERIN EX PADS: 1.0000 "application " | MEDICATED_PAD | CUTANEOUS | Status: DC | PRN

## 2015-03-03 MED ORDER — MEPERIDINE HCL 25 MG/ML IJ SOLN
INTRAMUSCULAR | Status: DC | PRN
  Administered 2015-03-03 (×2): 12.5 mg via INTRAVENOUS

## 2015-03-03 MED ORDER — SCOPOLAMINE 1 MG/3DAYS TD PT72
MEDICATED_PATCH | TRANSDERMAL | Status: AC
  Filled 2015-03-03: qty 1

## 2015-03-03 MED ORDER — LACTATED RINGERS IV SOLN
INTRAVENOUS | Status: DC | PRN
  Administered 2015-03-03 (×2): via INTRAVENOUS

## 2015-03-03 MED ORDER — LACTATED RINGERS IV SOLN
40.0000 [IU] | INTRAVENOUS | Status: DC | PRN
  Administered 2015-03-03: 40 [IU] via INTRAVENOUS

## 2015-03-03 MED ORDER — SIMETHICONE 80 MG PO CHEW: 80.0000 mg | CHEWABLE_TABLET | ORAL | Status: DC | PRN

## 2015-03-03 MED ORDER — KETOROLAC TROMETHAMINE 30 MG/ML IJ SOLN
30.0000 mg | Freq: Four times a day (QID) | INTRAMUSCULAR | Status: AC | PRN
  Administered 2015-03-03: 30 mg via INTRAVENOUS
  Filled 2015-03-03: qty 1

## 2015-03-03 MED ORDER — ZOLPIDEM TARTRATE 5 MG PO TABS: 5.0000 mg | ORAL_TABLET | Freq: Every evening | ORAL | Status: DC | PRN

## 2015-03-03 SURGICAL SUPPLY — 31 items
CLAMP CORD UMBIL (MISCELLANEOUS) IMPLANT
CLOTH BEACON ORANGE TIMEOUT ST (SAFETY) ×3 IMPLANT
DRAPE SHEET LG 3/4 BI-LAMINATE (DRAPES) IMPLANT
DRSG OPSITE POSTOP 4X10 (GAUZE/BANDAGES/DRESSINGS) ×3 IMPLANT
DURAPREP 26ML APPLICATOR (WOUND CARE) ×3 IMPLANT
ELECT REM PT RETURN 9FT ADLT (ELECTROSURGICAL) ×3
ELECTRODE REM PT RTRN 9FT ADLT (ELECTROSURGICAL) ×1 IMPLANT
EXTRACTOR VACUUM M CUP 4 TUBE (SUCTIONS) IMPLANT
EXTRACTOR VACUUM M CUP 4' TUBE (SUCTIONS)
GLOVE BIO SURGEON STRL SZ8.5 (GLOVE) ×3 IMPLANT
GOWN STRL REUS W/TWL 2XL LVL3 (GOWN DISPOSABLE) ×3 IMPLANT
GOWN STRL REUS W/TWL LRG LVL3 (GOWN DISPOSABLE) ×3 IMPLANT
KIT ABG SYR 3ML LUER SLIP (SYRINGE) IMPLANT
NEEDLE HYPO 25X5/8 SAFETYGLIDE (NEEDLE) IMPLANT
NS IRRIG 1000ML POUR BTL (IV SOLUTION) ×3 IMPLANT
PACK C SECTION WH (CUSTOM PROCEDURE TRAY) ×3 IMPLANT
PAD OB MATERNITY 4.3X12.25 (PERSONAL CARE ITEMS) ×3 IMPLANT
PENCIL SMOKE EVAC W/HOLSTER (ELECTROSURGICAL) ×3 IMPLANT
SUT CHROMIC 0 CT 802H (SUTURE) ×3 IMPLANT
SUT CHROMIC 0 MO4 CR (SUTURE) IMPLANT
SUT CHROMIC 1 CTX 36 (SUTURE) ×6 IMPLANT
SUT CHROMIC 2 0 SH (SUTURE) ×3 IMPLANT
SUT GUT PLAIN 0 CT-3 TAN 27 (SUTURE) IMPLANT
SUT MON AB 4-0 PS1 27 (SUTURE) ×3 IMPLANT
SUT PDS AB 0 CTX 36 PDP370T (SUTURE) IMPLANT
SUT VIC AB 0 CT1 18XCR BRD8 (SUTURE) IMPLANT
SUT VIC AB 0 CT1 8-18 (SUTURE)
SUT VIC AB 0 CTX 36 (SUTURE) ×4
SUT VIC AB 0 CTX36XBRD ANBCTRL (SUTURE) ×2 IMPLANT
TOWEL OR 17X24 6PK STRL BLUE (TOWEL DISPOSABLE) ×3 IMPLANT
TRAY FOLEY CATH SILVER 14FR (SET/KITS/TRAYS/PACK) ×3 IMPLANT

## 2015-03-03 NOTE — Op Note (Signed)
Preop diagnosis failure to progress in labor Postop diagnosis OP presentation Surgeon Dr. Francoise CeoBernard Cailah Reach Anesthesia epidural Procedure patient placed on the operating table in the supine position abdomen prepped and draped bladder emptied with a Foley catheter a transverse suprapubic incision made carried   to the rectus fascia fascia cleaned and incised length of the incision recti muscles retracted laterally peritoneum incised longtudially  transverse incision made on the visceroperitoneum above the bladder and the bladder mobilized inferiorly transverse low uterine incision made patient delivered from the  Op  position of a female Apgar 8 and 9 the placenta removed manually and sent to labor and delivery uterine cavity clean with dry laps uterine incision closed in one layer with continuous suture of #1 chromic hemostasis satisfactory bladder flap reattached  With 20  chromic uterus well contracted tubes and ovaries normal abdomen closed in layers peritoneum continuous with of 0 chromic fascia continuous with of 0 Dexon and the skin closes subcuticular stitch of 4-0 Monocryl blood last 650 cc

## 2015-03-03 NOTE — Anesthesia Postprocedure Evaluation (Signed)
  Anesthesia Post-op Note  Patient: Software engineerAriel Buendia  Procedure(s) Performed: Procedure(s) (LRB): CESAREAN SECTION (N/A)  Patient Location: PACU  Anesthesia Type: Epidural  Level of Consciousness: awake and alert   Airway and Oxygen Therapy: Patient Spontanous Breathing  Post-op Pain: mild  Post-op Assessment: Post-op Vital signs reviewed, Patient's Cardiovascular Status Stable, Respiratory Function Stable, Patent Airway and No signs of Nausea or vomiting  Last Vitals:  Filed Vitals:   03/03/15 0100  BP: 139/76  Pulse: 79  Temp:   Resp: 18    Post-op Vital Signs: stable   Complications: No apparent anesthesia complications

## 2015-03-03 NOTE — Progress Notes (Signed)
Notified Dr. Gaynell FaceMarshall at patient's bedside that temperature at 0645 was 100.0. No new orders.

## 2015-03-03 NOTE — Transfer of Care (Signed)
Immediate Anesthesia Transfer of Care Note  Patient: Software engineerAriel Hoh  Procedure(s) Performed: Procedure(s): CESAREAN SECTION (N/A)  Patient Location: PACU  Anesthesia Type:Epidural  Level of Consciousness: awake, alert  and oriented  Airway & Oxygen Therapy: Patient Spontanous Breathing  Post-op Assessment: Report given to RN and Post -op Vital signs reviewed and stable  Post vital signs: Reviewed and stable  Last Vitals:  Filed Vitals:   03/03/15 0100  BP: 139/76  Pulse: 79  Temp:   Resp: 18    Complications: No apparent anesthesia complications

## 2015-03-03 NOTE — Progress Notes (Signed)
Patient ID: [name], female   DOB: 04/22/1991, 24 y.o.   MRN: [id number] Postop day 0 Blood pressure 109/54 pulse 18 respiration 92 afebrile Abdomen soft fundus firm Lochia moderate Legs negative doing well

## 2015-03-03 NOTE — Anesthesia Postprocedure Evaluation (Signed)
  Anesthesia Post-op Note  Patient: Software engineerAriel Jones  Procedure(s) Performed: Procedure(s): CESAREAN SECTION (N/A)  Patient Location: Mother/Baby  Anesthesia Type:Epidural  Level of Consciousness: awake, alert  and oriented  Airway and Oxygen Therapy: Patient Spontanous Breathing  Post-op Pain: none  Post-op Assessment: Post-op Vital signs reviewed, Patient's Cardiovascular Status Stable, Respiratory Function Stable, Patent Airway, No signs of Nausea or vomiting, Adequate PO intake, Pain level controlled and No headache LLE Motor Response: Purposeful movement LLE Sensation: Tingling RLE Motor Response: Purposeful movement RLE Sensation: Tingling L Sensory Level: L2-Upper inner thigh, upper buttock R Sensory Level: L2-Upper inner thigh, upper buttock  Post-op Vital Signs: Reviewed and stable  Last Vitals:  Filed Vitals:   03/03/15 0545  BP: 109/54  Pulse: 92  Temp: 37.6 C  Resp: 18    Complications: No apparent anesthesia complications

## 2015-03-03 NOTE — Addendum Note (Signed)
Addendum  created 03/03/15 0740 by Junious SilkMelinda Latrail Pounders, CRNA   Modules edited: Notes Section   Notes Section:  File: [id number]

## 2015-03-03 NOTE — Lactation Note (Signed)
This note was copied from the chart of Tricia Jones. Lactation Consultation Note  Baby 13 hours old.  Reviewed hand expression.  Mother is semi flat. Assisted w/ latching baby and helped w holding during feeding. Provided shells and a hand pump to help evert nipples. Baby latched off and on.  Encouraged mother to support her breast. Discussed cluster feeding and basics. Mom made aware of O/P services, breastfeeding support groups, community resources, and our phone # for post-discharge questions.  Mom encouraged to feed baby 8-12 times/24 hours and with feeding cues.     Patient Name: Tricia Harlon Dittyriel Tokarz ZHYQM'VToday's Date: 03/03/2015 Reason for consult: Initial assessment   Maternal Data Has patient been taught Hand Expression?: Yes Does the patient have breastfeeding experience prior to this delivery?: No  Feeding Feeding Type: Breast Fed  LATCH Score/Interventions Latch: Repeated attempts needed to sustain latch, nipple held in mouth throughout feeding, stimulation needed to elicit sucking reflex. Intervention(s): Skin to skin;Waking techniques;Teach feeding cues  Audible Swallowing: A few with stimulation Intervention(s): Skin to skin  Type of Nipple: Everted at rest and after stimulation (semi flat)  Comfort (Breast/Nipple): Soft / non-tender     Hold (Positioning): Assistance needed to correctly position infant at breast and maintain latch.  LATCH Score: 7  Lactation Tools Discussed/Used     Consult Status Consult Status: Follow-up Date: 03/04/15 Follow-up type: In-patient    Tricia Jones, [facility] 03/03/2015, 3:40 PM

## 2015-03-03 NOTE — Progress Notes (Signed)
Patient ID: [name], female   DOB: 03/28/1991, 24 y.o.   MRN: [id number] Patient became fully dilated and pushed for  An hour  0 to plus one station she was rested for  An hour  and then pushed again for another hour and a half and she made no further progress it was decided she delivered by C-section for failure to progress in labor

## 2015-03-04 ENCOUNTER — Encounter (HOSPITAL_COMMUNITY): Payer: Self-pay | Admitting: Obstetrics

## 2015-03-04 NOTE — Lactation Note (Signed)
This note was copied from the chart of [name]. Lactation Consultation Note  Patient Name: [name] Date: 03/04/2015 Reason for consult: Follow-up assessment BF that reports she no longer needs formula. She stated yesterday baby was having a hard time latching and sucking. Today baby is latching very well and she feels like her milk is starting to change. She asked about taking a DEBP home. Went over options with lactation, WIC, and buying her own pump. Mom is aware of O/P services and the support group. She will page as needed for help.   Maternal Data    Feeding Feeding Type: Breast Fed Length of feed: 15 min  LATCH Score/Interventions                      Lactation Tools Discussed/Used     Consult Status Consult Status: Follow-up Date: 03/05/15 Follow-up type: In-patient    [name] 03/04/2015, 10:29 PM

## 2015-03-04 NOTE — Progress Notes (Signed)
Patient ID: [name], female   DOB: 03/12/1991, 24 y.o.   MRN: [id number] Postop day 1 Blood pressure 108/63 respiration 18 pulse 65 afebrile Abdomen soft Dressing dry Lochia moderate legs negative doing well

## 2015-03-05 NOTE — Progress Notes (Signed)
Patient ID: Tricia DittyAriel Jones, female   DOB: 04/02/1991, 24 y.o.   MRN: [id number] Postop day 2 Blood pressure 113/80 respiration 19 pulse 80 afebrile Fundus firm Lochia moderate Legs negative Doing well

## 2015-03-05 NOTE — Lactation Note (Signed)
This note was copied from the chart of Tricia [facility] Heinsohn. Lactation Consultation Note  Patient Name: Tricia Jones ZOXWR'UToday's Date: 03/05/2015 Reason for consult: Follow-up assessment;Other (Comment) (per mom the baby recently fed 1205 - 1215 p )  Baby is 59 hours and has been to the breast consistently . Per mom my breast are warmer and heavier.  Per mom the baby seems so much more satisfied.  LC explained to mom it's normal and a good sign her fatty milk is coming in.  [facility] reviewed the importance of skin to skin feedings and frequent feedings to prevent engorgement.  Per mom denies soreness. LC recommended feeding on the 1st until the breast softens and then offering the 2nd breast. If to start her breast are  Full hand express 7- 10 ml and feed until soften and then offer 2nd breast. Increase weight gain. LC reviewed tx for engorgement if moms experiences it ( ice 1st , then steps for latching) .    Maternal Data    Feeding Feeding Type: Breast Fed Length of feed: 10 min (per mom )  LATCH Score/Interventions                Intervention(s): Breastfeeding basics reviewed     Lactation Tools Discussed/Used     Consult Status Consult Status: Follow-up Date: 03/06/15 Follow-up type: In-patient    Kathrin Greathouseorio, Ilee Randleman Ann 03/05/2015, 12:41 PM

## 2015-03-06 NOTE — Progress Notes (Signed)
D/c teaching reviewed   Bracelets     Matched    [facility] questions [facility]

## 2015-03-06 NOTE — Discharge Instructions (Signed)
Discharge instructions   You can wash your hair  Shower  Eat what you want  Drink what you want  See me in 6 weeks  Your ankles are going to swell more in the next 2 weeks than when pregnant  No sex for 6 weeks   MARSHALL,BERNARD A, MD 03/06/2015

## 2015-03-06 NOTE — Lactation Note (Signed)
This note was copied from the chart of Tricia Jones. Lactation Consultation Note: Mother breastfeeding infant in football position when I entered the room. Infant sliding on and off. Assist mother with better support and proper latch.  Infant sustained latch for 10-15 mins. Mother has semi-flat nipples that firm with stimulation. Advised mother to use hand pump if needed to prep nipple before latching. Mother states that infant is cluster feeding every hour for the last several hours. Mother states she is feeling strong tugging. Mothe ris avtive with WIC. Discussed the importance of allowing infant to cluster and feed on cue. [facility], she doesn't have a scheduled follow up with WIC at this time. [facility]. Mother has a hand pump and will use as needed. Mothers breast are filling.  Lots of teaching with mother. Reviewed baby and me book on cluster feeding, treatment for severe engorgement. [place]. She was informed about LC services as needed. [place] [facility] [place]. Advised mother to seek support in her area [facility]. [facility] If needed.   Patient Name: Tricia Harlon Dittyriel Hitson RUEAV'WToday's Date: 03/06/2015 Reason for consult: Follow-up assessment   Maternal Data    Feeding Feeding Type: Breast Fed Length of feed: 15 min  LATCH Score/Interventions Latch: Grasps breast easily, tongue down, lips flanged, rhythmical sucking. Intervention(s): Skin to skin Intervention(s): Adjust position;Assist with latch;Breast compression  Audible Swallowing: Spontaneous and intermittent Intervention(s): Skin to skin;Hand expression  Type of Nipple: Everted at rest and after stimulation (semi flat nipples with slight edema but compressible )  Comfort (Breast/Nipple): Filling, red/small blisters or bruises,  mild/mod discomfort     Hold (Positioning): Assistance needed to correctly position infant at breast and maintain latch. Intervention(s): Support Pillows;Position options  LATCH Score: 8  Lactation Tools Discussed/Used     Consult Status Consult Status: Complete    Michel BickersKendrick, Thaer Miyoshi McCoy 03/06/2015, 11:33 AM

## 2015-03-06 NOTE — Discharge Summary (Signed)
Obstetric Discharge Summary Reason for Admission: induction of labor Prenatal Procedures: none Intrapartum Procedures: cesarean: low cervical, transverse Postpartum Procedures: none Complications-Operative and Postpartum: none HEMOGLOBIN  Date Value Ref Range Status  03/03/2015 8.2* 12.0 - 15.0 g/dL Final   HCT  Date Value Ref Range Status  03/03/2015 24.5* 36.0 - 46.0 % Final    Physical Exam:  General: alert Lochia: appropriate Uterine Fundus: firm Incision: healing well DVT Evaluation: No evidence of DVT seen on physical exam.  Discharge Diagnoses: Term Pregnancy-delivered  Discharge Information: Date: 03/06/2015 Activity: unrestricted Diet: routine Medications: Percocet Condition: improved Instructions: refer to practice specific booklet Discharge to: home Follow-up Information    Follow up with Kathreen CosierMARSHALL,BERNARD A, MD.   Specialty:  Obstetrics and Gynecology   Contact information:   [address] [place] [address] [phone]       Newborn Data: Live born female  Birth Weight: 7 lb 13.2 oz (3550 g) APGAR: 8, 9  Home with mother.  MARSHALL,BERNARD A 03/06/2015, 6:46 AM

## 2015-03-06 NOTE — Progress Notes (Signed)
Patient ID: Tricia DittyAriel Jones, female   DOB: 04/08/1991, 24 y.o.   MRN: [id number] Postop day 3 Blood pressure 116/62 respiration 18 pulse 67 afebrile Fundus firm Lochia moderate Legs negative Incision clean and dry home today
